# Patient Record
Sex: Female | Born: 1995 | Race: White | Hispanic: No | Marital: Single | Smoking: Never smoker
Health system: Southern US, Community
[De-identification: ages and names within clinical notes are randomized; demographics above are authoritative.]

---
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
## 2015-10-26 ENCOUNTER — Emergency Department (HOSPITAL_COMMUNITY): Payer: Managed Care, Other (non HMO)

## 2015-10-26 ENCOUNTER — Encounter (HOSPITAL_COMMUNITY): Payer: Self-pay | Admitting: *Deleted

## 2015-10-26 DIAGNOSIS — R002 Palpitations: Secondary | ICD-10-CM | POA: Diagnosis present

## 2015-10-26 LAB — BASIC METABOLIC PANEL
ANION GAP: 9 (ref 5–15)
BUN: 16 mg/dL (ref 6–20)
CALCIUM: 9.5 mg/dL (ref 8.9–10.3)
CO2: 27 mmol/L (ref 22–32)
Chloride: 102 mmol/L (ref 101–111)
Creatinine, Ser: 0.57 mg/dL (ref 0.44–1.00)
Glucose, Bld: 117 mg/dL — ABNORMAL HIGH (ref 65–99)
POTASSIUM: 4.6 mmol/L (ref 3.5–5.1)
SODIUM: 138 mmol/L (ref 135–145)

## 2015-10-26 LAB — CBC
HEMATOCRIT: 35.7 % — AB (ref 36.0–46.0)
HEMOGLOBIN: 10.3 g/dL — AB (ref 12.0–15.0)
MCH: 22.2 pg — ABNORMAL LOW (ref 26.0–34.0)
MCHC: 28.9 g/dL — ABNORMAL LOW (ref 30.0–36.0)
MCV: 77.1 fL — ABNORMAL LOW (ref 78.0–100.0)
Platelets: 421 10*3/uL — ABNORMAL HIGH (ref 150–400)
RBC: 4.63 MIL/uL (ref 3.87–5.11)
RDW: 18.4 % — ABNORMAL HIGH (ref 11.5–15.5)
WBC: 5.6 10*3/uL (ref 4.0–10.5)

## 2015-10-26 LAB — I-STAT TROPONIN, ED: TROPONIN I, POC: 0 ng/mL (ref 0.00–0.08)

## 2015-10-26 NOTE — ED Triage Notes (Signed)
Pt states for about a week has pain in her rib cage and back with movements, also feels like with any exertion feels like her heart beats fast, lasting up to a minute or two.

## 2015-10-27 ENCOUNTER — Emergency Department (HOSPITAL_COMMUNITY)
Admission: EM | Admit: 2015-10-27 | Discharge: 2015-10-27 | Disposition: A | Discharge status: Home / Self Care | Payer: Managed Care, Other (non HMO) | Attending: Emergency Medicine | Admitting: Emergency Medicine

## 2015-10-27 DIAGNOSIS — R000 Tachycardia, unspecified: Secondary | ICD-10-CM

## 2015-10-27 DIAGNOSIS — R002 Palpitations: Secondary | ICD-10-CM

## 2015-10-27 LAB — D-DIMER, QUANTITATIVE: D-Dimer, Quant: 0.46 ug/mL-FEU (ref 0.00–0.50)

## 2015-10-27 LAB — POC URINE PREG, ED: PREG TEST UR: NEGATIVE

## 2015-10-27 NOTE — ED Provider Notes (Signed)
[facility]PT Provider Note   CSN: [id number] Arrival date & time: 10/26/15  2028     History   Chief Complaint Chief Complaint  Patient presents with  . Palpitations    HPI Zhania Shaheen is a 20 y.o. female.  Patient is complaining about pain around the right side of her chest and into her back that has been present for approximately 1 week. Patient reports that she has noticed that her pain worsens when she takes a deep breath and does feel short of breath. Over this period of time she has noticed an accelerated heart rate. She has been sensing fast heartbeats and palpitations. She reports that the symptoms worsen with minimal exertion as well. She is normally very active person, grossly gym nearly every day. She has not been able to go because of the symptoms for the last couple of days. She denies any injury to the area.      History reviewed. No pertinent past medical history.  There are no active problems to display for this patient.   History reviewed. No pertinent surgical history.  OB History    No data available       Home Medications    Prior to Admission medications   Medication Sig Start Date End Date Taking? Authorizing Provider  acetaminophen (TYLENOL) 500 MG tablet Take 1,000 mg by mouth every 6 (six) hours as needed for moderate pain.   Yes Historical Provider, MD  amphetamine-dextroamphetamine (ADDERALL XR) 25 MG 24 hr capsule Take 25 mg by mouth every morning.   Yes Historical Provider, MD  amphetamine-dextroamphetamine (ADDERALL) 10 MG tablet Take 10 mg by mouth daily as needed (ADHD).   Yes Historical Provider, MD  ibuprofen (ADVIL,MOTRIN) 200 MG tablet Take 400 mg by mouth every 6 (six) hours as needed for moderate pain.   Yes Historical Provider, MD  sertraline (ZOLOFT) 50 MG tablet Take 50 mg by mouth daily.   Yes Historical Provider, MD    Family History No family history on file.  Social History Social History  Substance Use Topics    . Smoking status: Never Smoker  . Smokeless tobacco: Never Used  . Alcohol use Yes     Allergies   Review of patient's allergies indicates no known allergies.   Review of Systems Review of Systems  Cardiovascular: Positive for chest pain and palpitations.  All other systems reviewed and are negative.    Physical Exam Updated Vital Signs BP 119/74   Pulse 77   Temp 98.5 F (36.9 C) (Oral)   Resp 19   Ht 5\' 7"  (1.702 m)   Wt 127 lb (57.6 kg)   LMP 10/10/2015 (Exact Date)   SpO2 99%   BMI 19.89 kg/m   Physical Exam  Constitutional: She is oriented to person, place, and time. She appears well-developed and well-nourished. No distress.  HENT:  Head: Normocephalic and atraumatic.  Right Ear: Hearing normal.  Left Ear: Hearing normal.  Nose: Nose normal.  Mouth/Throat: Oropharynx is clear and moist and mucous membranes are normal.  Eyes: Conjunctivae and EOM are normal. Pupils are equal, round, and reactive to light.  Neck: Normal range of motion. Neck supple.  Cardiovascular: Regular rhythm, S1 normal and S2 normal.  Exam reveals no gallop and no friction rub.   No murmur heard. Pulmonary/Chest: Effort normal and breath sounds normal. No respiratory distress. She exhibits tenderness.    Abdominal: Soft. Normal appearance and bowel sounds are normal. There is no hepatosplenomegaly. There is no  tenderness. There is no rebound, no guarding, no tenderness at McBurney's point and negative Murphy's sign. No hernia.  Musculoskeletal: Normal range of motion.  Neurological: She is alert and oriented to person, place, and time. She has normal strength. No cranial nerve deficit or sensory deficit. Coordination normal. GCS eye subscore is 4. GCS verbal subscore is 5. GCS motor subscore is 6.  Skin: Skin is warm, dry and intact. No rash noted. No cyanosis.  Psychiatric: Her speech is normal and behavior is normal. Thought content normal. Her mood appears anxious.  Nursing note and  vitals reviewed.    ED Treatments / Results  Labs (all labs ordered are listed, but only abnormal results are displayed) Labs Reviewed  BASIC METABOLIC PANEL - Abnormal; Notable for the following:       Result Value   Glucose, Bld 117 (*)    All other components within normal limits  CBC - Abnormal; Notable for the following:    Hemoglobin 10.3 (*)    HCT 35.7 (*)    MCV 77.1 (*)    MCH 22.2 (*)    MCHC 28.9 (*)    RDW 18.4 (*)    Platelets 421 (*)    All other components within normal limits  D-DIMER, QUANTITATIVE ([facility])  Rosezena SensorI-STAT TROPOININ, ED  POC URINE PREG, ED    EKG  EKG Interpretation  Date/Time:  Monday October 26 2015 20:34:05 EDT Ventricular Rate:  122 PR Interval:  132 QRS Duration: 84 QT Interval:  306 QTC Calculation: 436 R Axis:   96 Text Interpretation:  Sinus tachycardia Biatrial enlargement Rightward axis Pulmonary disease pattern Nonspecific ST abnormality Abnormal ECG No previous tracing Confirmed by Blinda LeatherwoodPOLLINA  MD, Eliora Nienhuis [phone]([id number]) on 10/27/2015 3:55:30 AM       Radiology Dg Chest 2 View  Result Date: 10/26/2015 CLINICAL DATA:  Initial evaluation for acute chest pain, palpitations. EXAM: CHEST  2 VIEW COMPARISON:  None. FINDINGS: The heart size and mediastinal contours are within normal limits. Both lungs are clear. The visualized skeletal structures are unremarkable. IMPRESSION: No active cardiopulmonary disease. Electronically Signed   By: Rise MuBenjamin  McClintock M.D.   On: 10/26/2015 21:43    Procedures Procedures (including critical care time)  Medications Ordered in ED Medications - No data to display   Initial Impression / Assessment and Plan / ED Course  I have reviewed the triage vital signs and the nursing notes.  Pertinent labs & imaging results that were available during my care of the patient were reviewed by me and considered in my medical decision making (see chart for details).  Clinical Course    Patient presents to  the emergency department for evaluation of chest pain and palpitations. Symptoms have been ongoing for more than a week. Patient experiencing pain across her lower rib field and there is some tenderness associated with the pain. She does report increased pain with taking deep breaths. She is a nonsmoker and is not on oral contraceptives. She does not have a history of hypertension. She has essentially no risk factors for PE. She is in the low risk group by Wells criteria. Her d-dimer is also negative.  Patient does have intermittent episodes of sinus tachycardia here in the ER. When resting by herself in the room her heart rate drops, but when she is interviewed her heart rate increases. She reports that she feels more shortness of breath and pressure in her chest when she talks as well as exerting herself. Etiology at this  time is unclear. It is not felt that this is acute coronary syndrome and it is felt that PE is adequately ruled out. She will be referred to cardiology for repeat evaluation and possible further investigation.  Final Clinical Impressions(s) / ED Diagnoses   Final diagnoses:  Palpitations  Sinus tachycardia [facility])    New Prescriptions New Prescriptions   No medications on file     Gilda Crease, MD 10/27/15 [phone]

## 2015-11-20 ENCOUNTER — Ambulatory Visit

## 2015-11-20 NOTE — Progress Notes (Signed)
* * *      **  Laura Pennington**    ------    67 Y old Female, DOB: January 30, 1996    [address] [place], [place], [place] [address]    Home: [phone]    Provider: Burgess Amor[facility]        * * *    Telephone Encounter    ---    Answered by  Aida Raider Date: 11/20/2015       Time: 10:22 AM    Message                     Pt called and she is visitng from [place] where she goes to school and she wants to have a cardiologist here. She has records from her Cardiologist in [place] she would like a 2nd opinion she stated she has a irrregular heart rate. She will be going back to [facility] on Tuesday night. Dr. Harold Hedge [facility]        ------            Action Taken  [facility] 11/20/2015 10:37:12 AM > Patient is going to  be calling her cardiologist to fax the records- Got pcp note scanned in  Terenzi,Lisa 11/20/2015 2:31:05 PM > Delcid,Maira 11/20/2015 3:19:26 PM > I  spoke with pt's mom-Susan and per their request pt is booked for next monday  11/23/15 In [place].                * * *                ---          * * *         Patient: Laura Pennington, Laura Pennington DOB: 08-Jun-1995 Provider: Georga Kaufmann  11/20/2015    ---    Note generated by eClinicalWorks EMR/PM Software ([email or web address])

## 2015-11-23 ENCOUNTER — Ambulatory Visit: Admitting: Cardiovascular Disease

## 2015-11-23 ENCOUNTER — Other Ambulatory Visit: Payer: Self-pay | Admitting: Cardiology

## 2015-11-23 DIAGNOSIS — R0789 Other chest pain: Secondary | ICD-10-CM

## 2015-11-23 NOTE — Progress Notes (Signed)
* * *      **[name]**    ------    20 Y old Female, DOB: [birth year]    Account Number: [id number]    [address] [place], [id number]    Home: [phone]    Guarantor: [name] Insurance: Monia Pouch Payer ID: [id number]    PCP: Silvestre Mesi, MD Referring: Silvestre Mesi, MD    [facility] [place]        * * *    11/23/2015 Progress Notes: Carlota Raspberry, MD    ------    ---       **Current Medications**    ---    Taking    * Adderall XR 20 MG Capsule Extended Release 24 Hour 1 capsule in the morning Orally Once a day    ---    * Adderall 10 MG Tablet 1 tablet in the morning Orally Once a day as needed    ---    * Zoloft 50 MG Tablet 1 tablet Orally Once a day    ---    * Medication List reviewed and reconciled with the patient    ---      **Active Problem List**    ---      R00.2     Palpitations        ------    R07.89     Other chest pain        ------      **Past Medical History**    ---      Cleidocranial dystosis    ---    Chart hx of ADD, anorexia    ---      **Cardiovascular Diagnostics**    ---      ECHO: [place]. LVEF 55%. Nml wall motion. Nml RV size/fn. Trivial  MR/TR    ---    Holter: 48 H. 10-30-15. Max HR 171 bpm at 9:52 pm. Sinus vs AT. Gradual onset  and termination.    ---    ETT: sept 2017. 13 min. Bruce. 115 to 196 bpm. 98%.    ---     **Family History**    ---      Father: alive    ---    Mother: alive    ---    Negative for premature CAD or SCD.    ---      **Social History**    ---     _Tobacco Use:_    Smoking : Patient is a Current Smoker.    Goes to [employer] in [place]. [place]. Outside of [place].      **Allergies**    ---      N.K.D.A.    ---      **Review of Systems**    ---     _General/Constitutional_ :    Patient complaining of lightheadedness.    _Respiratory_ :    Patient denies cough.    _Cardiovascular_ :    Patient denies difficulty laying flat, fluid accumulation in the legs. Patient  complaining of chest pain with  exertion, irregular heartbeat, palpitations.    _Gastrointestinal_ :    Patient denies nausea.    _Hematology_ :    Patient denies easy bruising.    _Psychiatric_ :    Patient denies anxiety.          **Reason for Appointment**    ---      1\. 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 Since early sept she has  palpitations, exertional palpitations/chest sensation/dyspnea and a decline in  her exercise capacity.    She feels more breatheless than usual when climbing stairs. She also feels her  heart beating fast with exercise and at times without exercise. She is not  certain she had a fulmonant episode when wearing the holter or during the ETT.    She was seen in ER with a negative D-Dimer, negative pregnancy test    To make a long story short she is s/p ETT, echo and 48 h Holter which were  interpreted as unremarkable.    However, on her holter she has a HR of 171 range when she thinks she may have  not been doing anything. This would suggest the possibility of an atrial  tachycardia.    There is no syncope.    This was an extensive visit discussing whether adderall and anxiety could be  contributing to her symptoms. I reviewed her echo, ETT and holter. I discussed  side effects of metoprolol. I discussed possible atrial tachycardia, EP study  and ablation.    She is very tearfull in the office and her QOL is greatly impacted by these  symptoms. She does not necessarilit think she is anxious but acknowledges her  above medical regimen,    No significant relationship to caffeine.      **Vital Signs**    ---    BP R 96/67, HR 91, Ht 67, Wt 132, BMI 20.67, Oxygen sat % 100    L 123/79.      **Physical Examination**    ---     _General Examination_ :    GENERAL APPEARANCE: alert, in no acute distress.    HEAD: normocephalic.    EYES: sclera non-icteric.    ORAL CAVITY: mucosa moist.     NECK/THYROID: carotid pulse normal, no carotid bruit, no jugular venous  distention.    SKIN: no rashes.    HEART: regular rate and rhythm, normal S1, normal S2.    LUNGS: clear to auscultation bilaterally.    CHEST: no costochondral tenderness.    ABDOMEN: soft, nontender, nondistended, bowel sounds present, no abdominal or  flank bruits.    MUSCULOSKELETAL: no focal muscle tenderness.    EXTREMITIES: no clubbing, cyanosis, or edema.    PERIPHERAL PULSES: 2+ posterior tibial, 2+ femoral, 2+ radial.    NEUROLOGIC: nonfocal, alert and oriented.    PSYCH: alert, oriented.         **EKG**    ---    NSR 91 bpm. Nml axis/intervals. No significant ST-T wave abnormalities.      **Assessment and Plan**    ---    1\. Palpitations - R00.2 (Primary)    ---    2\. Other chest pain - R07.89    ---     20 year old female with a palpitations and associated chest  discomfort/dyspnea. ALthough her 48 H holter was interpreted to be normal, she  is generated a HRof 171 bpm at 9:52 pm when she says she was not exercising.  This rhythm would other wise appear to be sinus tachycardia but if it were  truly rest in onset, atrial tachycardia is a consideration.    I discussed anxiety, catecholamine effects on the heart, possible SVT that has  not been captured, possible atrial tachycardia.  I doubt this is inappropriate  sinus tachycardia. There does not appear to be a relationship between  cleidocranial dystosis. I am going to review her Holter with EP and obtain a  30 day monitor to really trying to correlate her symptoms.    ---      **Treatment**    ---      **1\. Palpitations**    Start Metoprolol Tartrate Tablet, 25 MG, 1/2 tab twice daily, Orally, Twice a  day PRN, 30 day(s), 60, Refills 11    _LAB: TSH W/ REFLEX FREE T4_    _IMAGING: Event Monitor - Standard_    ---       **Diagnostic Imaging**    ------    _Imaging: Electrocardiogram (EKG)_    ---      **Procedure Codes**    ---      93000 -ELECTROCARDIOGRAM, COMPLETE     ---      **Follow Up**    ---    2 Months    Electronically signed by Carlota Raspberry , MD on 11/23/2015 at 04:12 PM EDT    Sign off status: Completed        * * [facility] [place]    [address]    [place], [place] [address]    Tel: [phone]    Fax: [phone]              * * *         Patient: Angelik, Walls DOB: Jul 14, 1995 Progress Note: Carlota Raspberry, MD  11/23/2015    ---    Note generated by eClinicalWorks EMR/PM Software ([email or web address])

## 2015-11-24 ENCOUNTER — Encounter: Payer: Self-pay | Admitting: Cardiovascular Disease

## 2015-11-26 ENCOUNTER — Ambulatory Visit

## 2015-12-01 ENCOUNTER — Ambulatory Visit: Admitting: Cardiovascular Disease

## 2015-12-01 NOTE — Progress Notes (Signed)
* * *      **  Laura Pennington**    ------    60 Y old Female, DOB: 10-24-1995    [address], [place], [place], [place] [address]    Home: [phone]    Provider: Juanita Craver        * * *    Telephone Encounter    ---    Answered by  Leda Quail Date: 12/01/2015       Time: 09:28 AM    Caller  Laura Pennington patients mom    ------            Reason  further testing            Message                     Laura Pennington patients mom called and would like to talk with you in regards to [facility] and whats going on and coordinate with all the drs and get everyone on the same page. Also pediatrician did some further testing that she will have them fax over for you to look at.       Laura Pennington (pts mom) [phone]                Action Taken  Nada Libman 12/01/2015 3:37:12 PM > Records from pediatric  came trought the fax, they are on your desk. Juanita Craver 12/03/2015  7:38:33 AM > I talked with mother                * * *                ---          * * *         Patient: Laura Pennington, Laura Pennington DOB: Oct 21, 1995 Provider: Juanita Craver 12/01/2015    ---    Note generated by eClinicalWorks EMR/PM Software ([email or web address])

## 2015-12-02 ENCOUNTER — Ambulatory Visit (HOSPITAL_COMMUNITY): Admission: RE | Admit: 2015-12-02 | Payer: Managed Care, Other (non HMO) | Source: Ambulatory Visit

## 2015-12-03 ENCOUNTER — Ambulatory Visit: Admitting: Cardiovascular Disease

## 2015-12-03 NOTE — Progress Notes (Signed)
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  She has an atrial tachycardia vs sinus tachycardia.      She needs to see PJB.  Can you schedule her on the day that she scheduled to see me or around that day with PJB. (cancel me ) She will be home then for Thanksgiving.  Please call her mother to to schedule Darl Pikes (pts mom) [phone]                 Action Taken  Nada Libman 12/03/2015 3:54:14 PM > Ok forwarding this bean to  [place] to book w/PJB on 11/20 or around this date and cancelled appt w/ESD  which is also booked same day Leach,Kaitlyn 12/04/2015 9:30:41 AM > Spoke to  patients mom booked with PJB for 11/22. ESD appt. cancelled.                * * *                ---          * * *         Patient: Laura, Pennington DOB: Jun 17, 1995 Provider: Juanita Craver 12/03/2015    ---    Note generated by eClinicalWorks EMR/PM Software ([email or web address])

## 2015-12-07 ENCOUNTER — Ambulatory Visit

## 2015-12-25 ENCOUNTER — Ambulatory Visit: Admitting: Cardiovascular Disease

## 2015-12-25 NOTE — Progress Notes (Signed)
* * *      **  Laura Pennington**    ------    61 Y old Female, DOB: 1995/09/11    [address] [place], [place], [place] [address]    Home: [phone]    Provider: Juanita Craver        * * *    Telephone Encounter    ---    Answered by  Albesa Seen Date: 12/25/2015       Time: 05:09 PM    Reason  monitor return    ------            Message                     LMOM to return monitor                Action Taken  Bunting,Melissa 12/25/2015 5:11:11 PM >                * * *                ---          * * *         Patient: Laura Pennington DOB: 1995/10/20 Provider: Juanita Craver 12/25/2015    ---    Note generated by eClinicalWorks EMR/PM Software ([email or web address])

## 2015-12-29 ENCOUNTER — Ambulatory Visit: Admitting: Cardiovascular Disease

## 2015-12-30 ENCOUNTER — Ambulatory Visit: Admitting: Cardiovascular Disease

## 2015-12-30 NOTE — Progress Notes (Signed)
* * *      **Laura Pennington**    ------    2 Y old Female, DOB: [birth year]    Account Number: [id number]    [address], [place], [place]    Home: [phone]    Guarantor: Laura Pennington Insurance: Monia Pouch Payer ID: [id number]    PCP: Silvestre Mesi, MD Referring: Silvestre Mesi, MD    Appointment Facility: [facility]        * * *    12/30/2015 Progress Notes: Sherre Lain, MD    ------    ---       **Current Medications**    ---    Taking    * Metoprolol Tartrate 25 MG Tablet 1/2 tab twice daily Orally Twice a day    ---    * Adderall XR 20 MG Capsule Extended Release 24 Hour 1 capsule in the morning Orally Once a day    ---    * Adderall 10 MG Tablet 1 tablet in the morning Orally Once a day as needed    ---    * Zoloft 50 MG Tablet 1 tablet Orally Once a day    ---    * Medication List reviewed and reconciled with the patient    ---      **Active Problem List**    ---      R00.2     Palpitations        ------    R07.89     Other chest pain        ------      **Past Medical History**    ---      Cleidocranial dystosis    ---    ADD    ---    Anorexia    ---      **Cardiovascular Diagnostics**    ---      ECHO: [place]. LVEF 55%. Nml wall motion. Nml RV size/fn. Trivial  MR/TR    ---    Holter: 48 H. 10-30-15. Max HR 171 bpm at 9:52 pm. Sinus vs AT. Gradual onset  and termination.    ---    ETT: sept 2017. 13 min. Bruce. 115 to 196 bpm. 98%.    ---    TTM: 12/25/15: Sx ST. Pt reports HR as high as 173 bpm w/min exertion/rest.  TTM suggests inappropriate ST.    ---     **Family History**    ---      Father: alive    ---    Mother: alive    ---    Negative for premature CAD or SCD.    ---      **Social History**    ---     _Tobacco Use:_    Smoking : Patient is a Non smoker.    Goes to [employer] in [place] which is outside of [place]. She drinks one  cup of coffee each day. She does not drink energy drinks.      **Allergies**    ---      N.K.D.A.    ---       **Review of Systems**    ---     _General/Constitutional_ :    Patient denies fever or chills.    _ENT_ :    Patient denies nosebleeds.    _Respiratory_ :    Patient denies cough, wheezing,or hemoptysis.    _Gastrointestinal_ :    Patient denies blood in stool.    _Hematology_ :  Patient denies prolonged bleeding.    _Genitourinary_ :    Patient denies blood in the urine.    _Musculoskeletal_ :    Patient denies muscle aches.    _Peripheral Vascular_ :    Patient denies claudication.    _Skin_ :    Patient denies hives.    _Neurologic_ :    Patient denies focal weakness.          **Reason for Appointment**    ---      1\. Palpitations    ---    2\. Tachycardia    ---      **History of Present Illness**    ---     __ :    Ms. Laura Pennington has been referred to me by Dr. Ignacia Palma for further  evaluation of palpitations and tachycardia. As you recall, Ms. 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 She reports that she was doing well until this past September when  she started to experience chest pain when taking a deep breath. She also  noticed the onset of a racing heart beat with minimal exertion. She denies any  viral symptoms at the time. She was evaluated by a cardiologist in [place]. Her  cardiac evaluation included a stress test, echocardiogram, and a 48 hour  Holter monitor. All of the studies were unremarkable. She was followed  expectantly and her chest pain resolved about a week later. However, she  continued to have a sensation of an inappropriately elevated heart rate at  rest and with minimal exertion. She was evaluated by Dr. Ignacia Palma in October  and an event monitor was placed. The monitor revealed heart rates up to 171  beats per minute while at rest and with minimal exertion. Suspecting that she  had either inappropriate sinus tachycardia or an atrial tachycardia, Dr.  Ignacia Palma prescribed metoprolol tartrate 12.5 mg twice daily. Her  palpitations  have improved, but they have not resolved. Otherwise, Ms. Laura Pennington has been  doing well. She denies any episodes of presyncope, syncope, orthopnea, or  lower extremity edema. She had an autonomic test done yesterday at [facility]. She  is awaiting these results. She denies any heat or cold intolerance. She also  denies any nausea, vomiting, diarrhea, or constipation. She stopped taking  Adderall for two weeks as a trial and her symptoms did not improve. A Lyme  screen was negative. She is being tested for [employer] virus. She reports  having normal thyroid function tests.      **Vital Signs**    ---    BP 103/62, HR 82, Ht 67, Wt 133, BMI 20.83, Oxygen sat % 100.      **Physical Examination**    ---     _General Examination_ :    GENERAL APPEARANCE: well developed, well nourished.    HEAD: normocephalic, atraumatic.    ORAL CAVITY: moist mucous membranes.    NECK/THYROID: carotid pulse normal, no carotid bruit.    HEART: regular rate and rhythm, normal S1, normal S2, no murmur, rub, or  gallop.    LUNGS: clear to auscultation bilaterally.    CHEST: normal shape and expansion.    ABDOMEN: soft, nontender, nondistended, bowel sounds present.    EXTREMITIES: no edema.    PERIPHERAL PULSES: 2+ throughout.    NEUROLOGIC: alert and oriented.    PSYCH: cooperative with exam.         **EKG**    ---  Normal sinus rhythm at a rate of 81 bpm. Normal axis and intervals. No  significant changes compared to her previous electrocardiogram.      **Assessment and Plan**    ---    1\. Palpitations - R00.2 (Primary)    ---    2\. Other chest pain - R07.89    ---     IMPRESSION: Ms. Laura Pennington is a healthy 20 year old female with recent onset of  palpitations. Cardiac testing has revealed a structurally normal heart. An  event monitor has revealed evidence of either inappropriate sinus tachycardia  or atrial tachycardia. She has no other symptoms to suggest autonomic  dysfunction. I think a diagnosis of POTS is unlikely as  many of her episodes  occur while she is sitting. We had a long discussion regarding her treatment  options. These options would include continuing the current dose of  metoprolol, switching to diltiazem, initiating an antiarrhythmic agent, or  proceeding to an electrophysiology study. Her preference is to continue the  current dose of metoprolol which I think is reasonable. She is scheduled to  return to my office in May after she returns from college. Thank you for  allowing me the opportunity to participate in the care of Laura Pennington. As  always, please feel free to call with any questions.    ---      **Treatment**    ---      **1\. Palpitations**    Continue Metoprolol Tartrate Tablet, 25 MG, 1/2 tab twice daily, Orally, Twice  a day    ---       **Diagnostic Imaging**    ------    _Imaging: Electrocardiogram (EKG)_    ---      **Procedure Codes**    ---      93000 -ELECTROCARDIOGRAM, COMPLETE    ---      **Follow Up**    ---    6 Months    Electronically signed by Sherre Lain , MD on 12/30/2015 at 06:00 PM EST    Sign off status: Completed        * * [facility]    [address]    [place], [place] [id number]    Tel: [phone]    Fax: [phone]              * * *         Patient: Laura Pennington, Laura Pennington DOB: [birth year] Progress Note: Sherre Lain, MD  12/30/2015    ---    Note generated by eClinicalWorks EMR/PM Software ([email or web address])

## 2015-12-30 NOTE — Progress Notes (Signed)
* * *      **  Laura Pennington**    ------    22 Y old Female, DOB: 04-07-1995    [address], [place], [place], [place] [address]    Home: [phone]    Provider: Juanita Craver        * * *    Telephone Encounter    ---    Answered by  Albesa Seen Date: 12/30/2015       Time: 04:21 PM    Reason  monitor return    ------            Message                     monitor has been returned                Action Taken  Bunting,Melissa 12/30/2015 4:21:29 PM >                * * *                ---          * * *         Patient: Laura Pennington DOB: 08/10/1995 Provider: Juanita Craver 12/30/2015    ---    Note generated by eClinicalWorks EMR/PM Software ([email or web address])

## 2015-12-30 NOTE — Progress Notes (Signed)
* * *      **  [name]**    ------    35 Y old Female, DOB: Nov 16, 1995    [address] [place], [place], [place] [address]    Home: [phone]    Provider: Juanita Craver        * * *    Telephone Encounter    ---    Answered by  Deanne Coffer Date: 12/30/2015       Time: 05:51 PM    Reason  consult    ------            Message                     Luan Pulling,       I met with Tresa Endo and her mother today.  It is still not clear to me if she has inappropriate sinus tachycardia or atrial tachycardia.  We had a long discussion regarding her treatment options.  We agreed to continue the current dose of metoprolol.  If this fails, then we will consider an ablation therapy with the hope the weekend eliminating atrial tachycardia.  I tentatively scheduled a follow-up appointment for her with me in May.  It is okay with you if I follow her?      PB                Action Taken  No problem. Thanks for seeing her. Juanita Craver 01/03/2016  4:52:26 PM >                * * *                ---          * * *         Patient: Brianni, Manthe DOB: [birth year] Provider: Juanita Craver 12/30/2015    ---    Note generated by eClinicalWorks EMR/PM Software ([email or web address])

## 2016-01-06 ENCOUNTER — Ambulatory Visit

## 2016-07-12 LAB — HX CBC W/ DIFF
HX BASO#: 0.1 10*3/uL (ref 0.0–0.1)
HX BASO%: 0.9 %
HX EOS#: 0.1 10*3/uL (ref 0.0–0.5)
HX EOS%: 1.3 %
HX HCT: 37.4 % (ref 36.0–48.0)
HX HGB: 11.3 g/dL — ABNORMAL LOW (ref 12.0–16.0)
HX LYMPH#: 1.8 10*3/uL (ref 1.0–4.0)
HX LYMPH%: 33.8 %
HX MCH: 25.2 pg — ABNORMAL LOW (ref 27.0–32.0)
HX MCHC: 30.2 g/dL — ABNORMAL LOW (ref 32.0–36.0)
HX MCV: 83.5 FL (ref 79.0–97.0)
HX MONO#: 0.4 10*3/uL (ref 0.0–1.5)
HX MONO%: 8.3 %
HX NUCLEATED RBC %: 0 /100{WBCs}
HX PLT: 379 10*3/uL (ref 150–400)
HX RBC: 4.48 10*6/uL (ref 4.00–5.20)
HX RDW: 20.6 % — ABNORMAL HIGH (ref 11.5–14.5)
HX SEG%: 55.5 %
HX SEGS#: 3 10*3/uL (ref 2.4–8.1)
HX WBC: 5.3 10*3/uL (ref 4.0–11.0)

## 2016-07-13 ENCOUNTER — Ambulatory Visit: Admitting: Cardiovascular Disease

## 2016-07-13 NOTE — Progress Notes (Signed)
* * *      [name]**    ------    89 Y old Female, DOB: Feb 20, 1995    Account Number: [id number]    [address] [place], [place]    Home: [phone]    Guarantor: [name] Insurance: Monia Pouch Payer ID: [id number]    PCP: Silvestre Mesi, MD Referring: Silvestre Mesi, MD    Appointment Facility: [facility]        * * *    07/13/2016 Follow Up: Sherre Lain, MD    ------    ---       **Current Medications**    ---    Taking    * Metoprolol Tartrate 25 MG Tablet 0.5 tablet Orally Twice a day as needed    ---    * Fish Oil 1000 MG Capsule 1 capsule Orally Once a day    ---    * Vitamin B12 100 MCG Tablet 1 tablet Orally Once a day    ---    * Iron 325 (65 Fe) MG Tablet 1 tablet Orally Once a day    ---    * Adderall XR 25 MG Capsule Extended Release 24 Hour 1 capsule in the morning Orally Once a day    ---    * Adderall 10 MG Tablet 1 tablet in the morning Orally Once a day as needed    ---    * Zoloft 50 MG Tablet 1 tablet Orally Once a day    ---    * Medication List reviewed and reconciled with the patient    ---      **Active Problem List**    ---      R00.2     Palpitations        ------      **Past Medical History**    ---      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 LVEF 55%. Nml wall motion. Nml RV size/fn. Trivial  MR/TR    ---    Holter: 48 H. 10-30-15. Max HR 171 bpm at 9:52 pm. Sinus vs AT. Gradual onset  and termination.    ---    ETT: sept 2017. 13 min. Bruce. 115 to 196 bpm. 98%.    ---    TTM: 12/25/15: Sx ST. Pt reports HR as high as 173 bpm w/min exertion/rest.  TTM suggests inappropriate ST.    ---     **Family History**    ---      Father: alive    ---    Mother: alive    ---    Negative for premature coronary artery disease or sudden cardiac  death.    ---      **Social History**    ---     _Tobacco Use:_    Smoking : Patient is a Non smoker.    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 She drinks one cup of  coffee each day. She does not drink energy drinks.      **Allergies**    ---      N.K.D.A.    ---      **  Review of Systems**    ---     _General/Constitutional_ :    Patient denies fevers or chills.    _Respiratory_ :    Patient denies hemoptysis.    _Gastrointestinal_ :    Patient denies blood in the stool.    _Peripheral Vascular_ :    Patient denies claudication.          **Reason for Appointment**    ---      1\. Palpitations    ---    2\. Tachycardia    ---      **History of Present Illness**    ---     __ :    Ms. Ercelle Winkles returns to the office for further evaluation of palpitations  and tachycardia. As you recall, Ms. Sachs is a 21 year old female with a  history of ADD, anorexia, and cleidocranial dystosis. She was doing well until  September 2017 when she started to experience chest pain when taking a deep  breath. She also noticed the onset of a racing heart beat with minimal  exertion. 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 She  was evaluated by a local cardiologist. Her cardiac evaluation included a  stress test, echocardiogram, and a 48 hour Holter monitor. All of the studies  were unremarkable. She was followed expectantly and her chest pain resolved  about a week later. However, she continued to have a sensation of an  inappropriately elevated heart rate at rest and with minimal exertion. She was  evaluated by Dr. Ignacia Palma in October 2017 and an event monitor was placed. The  monitor revealed heart rates up to 171 beats per minute while at rest and with  minimal exertion. Suspecting that she had either inappropriate sinus  tachycardia or an atrial tachycardia, Dr. Ignacia Palma prescribed metoprolol  tartrate 12.5 mg twice daily. Her palpitations have improved, but they have  not resolved.  I met with her in November 2017 and I suspected that her  episodes were secondary to either inappropriate sinus tachycardia or atrial  tachycardia. We reviewed her treatment options and her preference was to use  metoprolol on a prn basis. She has since found that the episodes are triggered  by anxiety. The episodes are less frequent and seem to respond to the  metoprolol. She denies any episodes of chest pain, shortness of breath,  presyncope, syncope, orthopnea, or lower extremity edema.      **Vital Signs**    ---    BP 94/62, HR 79, Ht 67, Wt 137, BMI 21.45, Oxygen sat % 98.      **Physical Examination**    ---     _General Examination_ :    GENERAL APPEARANCE: well developed, well nourished.    HEAD: normocephalic, atraumatic.    ORAL CAVITY: moist mucous membranes.    NECK/THYROID: carotid pulse normal, no carotid bruit.    HEART: regular rate and rhythm, normal S1, normal S2, no murmur, rub, or  gallop.    LUNGS: clear to auscultation bilaterally.    CHEST: normal shape and expansion.    ABDOMEN: soft, nontender, nondistended, bowel sounds present.    EXTREMITIES: no edema.    PERIPHERAL PULSES: 2+ throughout.    NEUROLOGIC: alert and oriented.    PSYCH: cooperative with exam.         **EKG**    ---    Normal sinus rhythm at a rate of 79 bpm. Normal axis and intervals. No  significant changes  compared to her previous electrocardiogram.      **Assessment and Plan**    ---    1\. Palpitations - R00.2 (Primary)    ---     IMPRESSION: Ms. Obeirne is doing well. I continue to suspect that her  palpitations are secondary to either inappropriate sinus tachycardia or atrial  tachycardia. At this point, her episodes are infrequent, brief, and mild in  severity. Therefore, I would recommend we continue to manage her palpitations  with metoprolol tartrate on a prn basis.    Thank you for allowing me the opportunity to continue to participate in the  care of Ms. Beadles. She is scheduled to return to my office in 1  year for  follow-up evaluation. I will certainly see her earlier if any new issues  arise. As always, please feel free to call with any questions.    ---      **Treatment**    ---      **1\. Palpitations**    Continue Metoprolol Tartrate Tablet, 25 MG, 0.5 tablet, Orally, Twice a day as  needed    ---       **Diagnostic Imaging**    ------    _Imaging: Electrocardiogram (EKG)_    ---      **Procedure Codes**    ---      93000 -ELECTROCARDIOGRAM, COMPLETE    ---      **Follow Up**    ---    1 Year    Electronically signed by Sherre Lain , MD on 07/13/2016 at 09:21 AM EDT    Sign off status: Completed        * * [facility]    [address]    [place], [place] [id number]    Tel: [phone]    Fax: [phone]              * * *         Patient: Logyn, Dedominicis DOB: 02-26-1995 Progress Note: Sherre Lain, MD  07/13/2016    ---    Note generated by eClinicalWorks EMR/PM Software ([email or web address])

## 2016-07-25 ENCOUNTER — Ambulatory Visit

## 2016-12-01 ENCOUNTER — Ambulatory Visit: Admitting: Cardiovascular Disease

## 2016-12-01 NOTE — Progress Notes (Signed)
* * *      **  Laura Pennington**    ------    64 Y old Female, DOB: 1995-08-30    [address], [place], [place], [place] [address]    Home: [phone]    Provider: Deanne Coffer        * * *    Telephone Encounter    ---    Answered by  Leda Quail Date: 12/01/2016       Time: 03:48 PM    Caller  patient    ------            Reason  cardiac clearance for medication            Message                     Patient needs a letter sent over for cardiac clearance letter faxed over to Dr Burnadette Pop, stating that its ok to continue taking the stimulant Aderol      fax: [phone]      phone: [phone]                Action Taken  Last office note faxed to number above stating PJB is aware  patient is taking Adderall. [facility] 12/01/2016 4:01:33 PM >                * * *                ---          * * *         Patient: Laura Pennington DOB: [birth year] Provider: Deanne Coffer  12/01/2016    ---    Note generated by eClinicalWorks EMR/PM Software ([email or web address])

## 2017-03-01 LAB — LIPID PROFILE (EXT)
Cholesterol (EXT): 162 mg/dL (ref 140–200)
HDL Cholesterol (EXT): 62 mg/dL (ref >40)
LDL Cholesterol, CALC (EXT): 88 mg/dL (ref 0–129)
Risk Factor (EXT): 2.6 (ref 0–4.4)
Triglycerides (EXT): 58 mg/dL (ref 0–149)

## 2017-03-01 LAB — CMP (EXT)
ALT/SGPT (EXT): 7 U/L — ABNORMAL LOW (ref 10–49)
AST/SGOT (EXT): 15 U/L (ref 6–40)
Albumin (EXT): 4.8 g/dL (ref 3.5–4.8)
Alkaline Phosphatase (EXT): 61 U/L (ref 27–129)
Anion Gap (EXT): 9 mmol/L (ref 3–17)
BUN (EXT): 14 mg/dL (ref 9–23)
Bilirubin, Total (EXT): 0.9 mg/dL (ref 0.0–1.0)
CO2 (EXT): 27 mmol/L (ref 20–31)
CalciumCalcium (EXT): 9.7 mg/dL (ref 8.7–10.4)
Chloride (EXT): 106 mmol/L (ref 95–106)
Creatinine (EXT): 0.73 mg/dL (ref 0.50–1.30)
GFR Estimated (Calc) (EXT): 116 mL/min/{1.73_m2} (ref >60)
Globulin (EXT): 2.6 g/dL (ref 1.9–4.1)
Glucose (EXT): 86 mg/dL (ref 74–106)
Potassium (EXT): 5.1 mmol/L (ref 3.5–5.2)
Protein (EXT): 7.4 g/dL (ref 5.7–8.2)
Sodium (EXT): 142 mmol/L (ref 136–145)

## 2017-04-10 IMAGING — CR DG CHEST 2V
2 series · 2 of 2 positions shown · non-contrast
Comparison: None.

CLINICAL DATA: Initial evaluation for acute chest pain,
palpitations.

EXAM:
CHEST  2 VIEW

[chest pa]
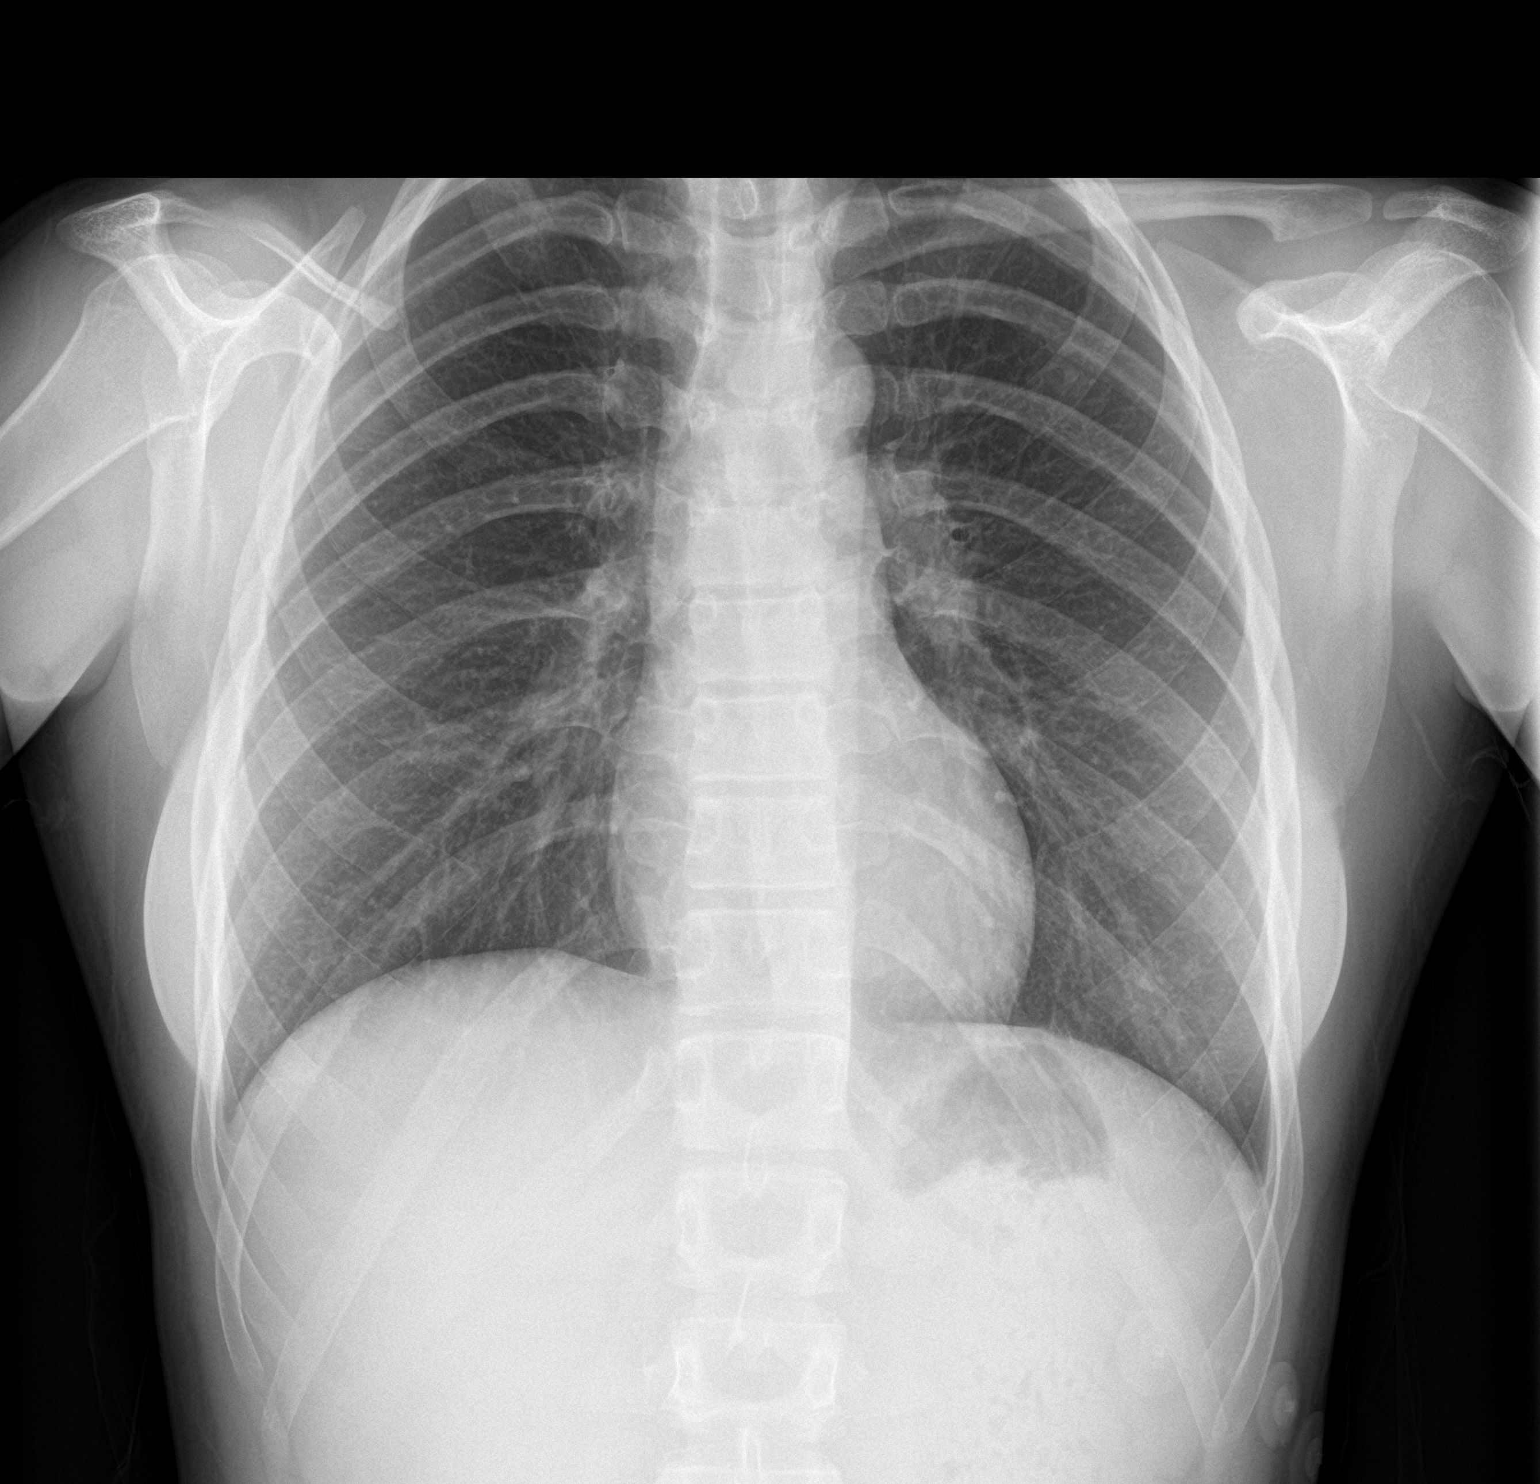

[chest lat]
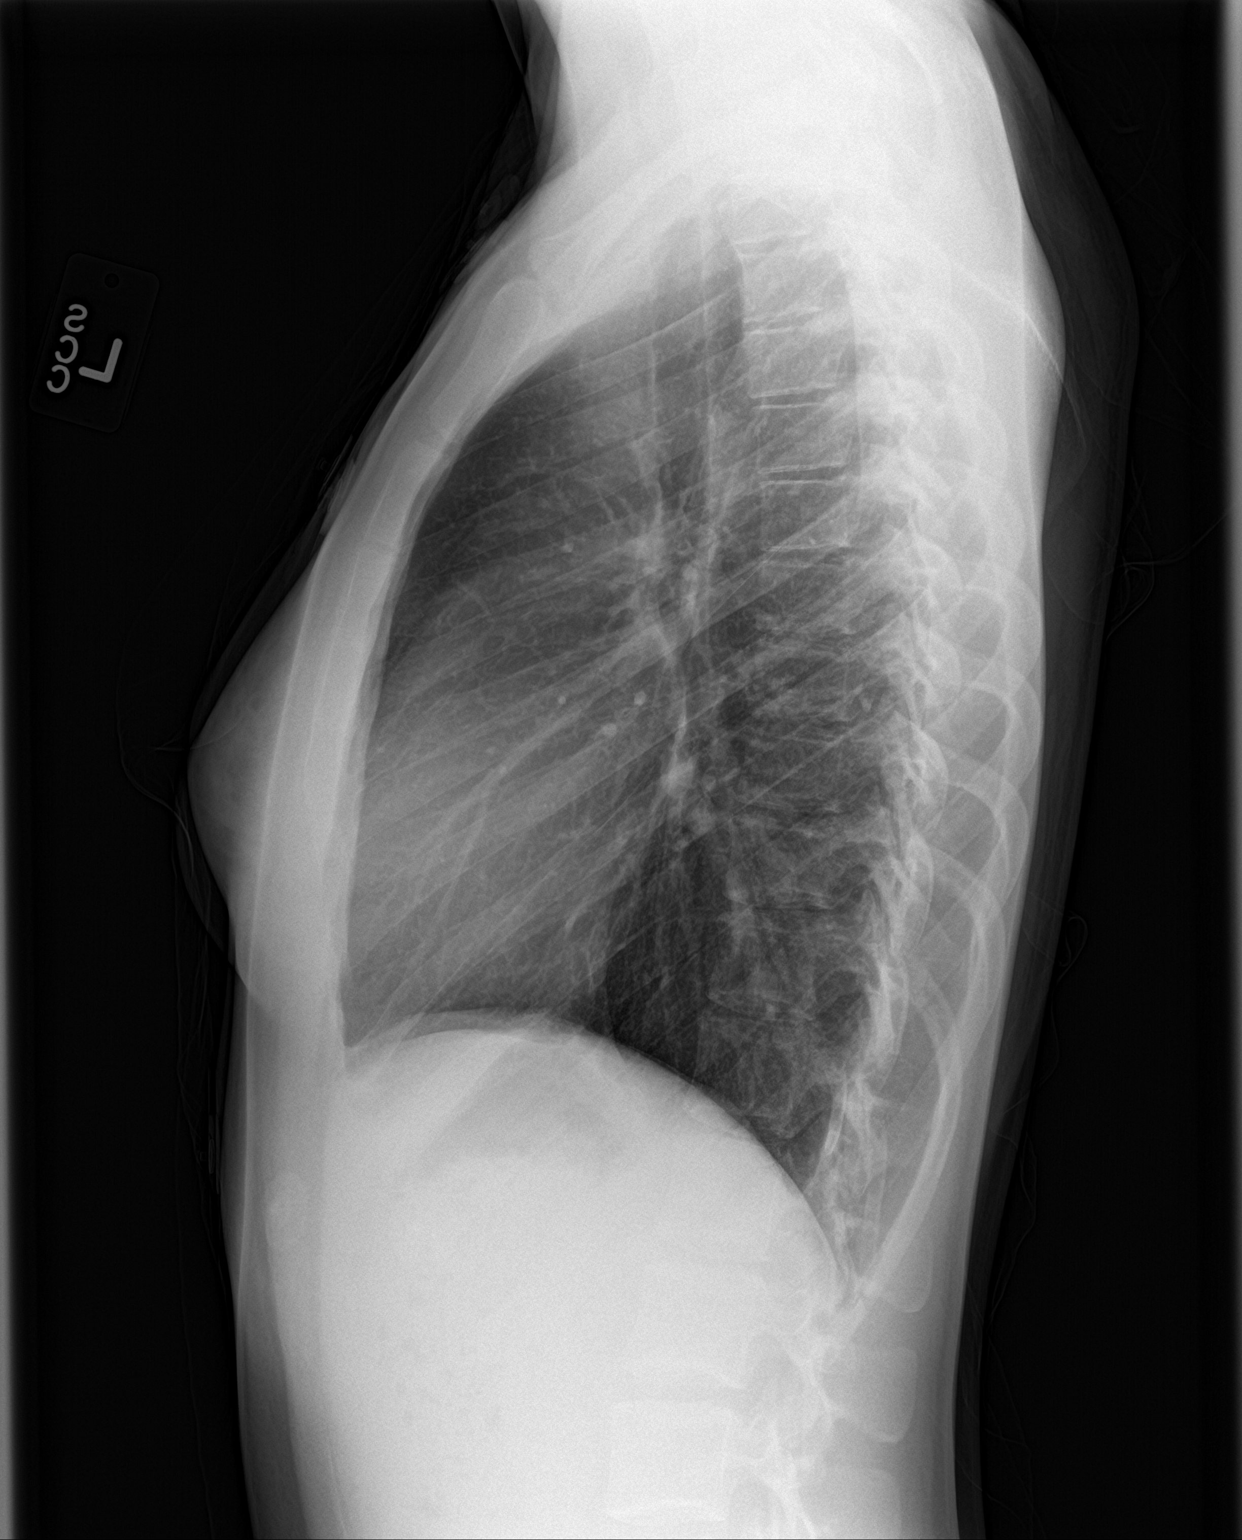

[2 of 2 positions shown; findings below may reference images not displayed]

FINDINGS: The heart size and mediastinal contours are within normal limits.
Both lungs are clear. The visualized skeletal structures are
unremarkable.
IMPRESSION: No active cardiopulmonary disease.

## 2017-07-27 ENCOUNTER — Ambulatory Visit: Admitting: Cardiovascular Disease

## 2017-07-27 NOTE — Progress Notes (Signed)
* * *      **  Laura Pennington**    ------    76 Y old Female, DOB: May 17, 1995    [address] [place], [place], [place] [address]    Home: [phone]    Provider: Deanne Coffer        * * *    Telephone Encounter    ---    Answered by  Leda Quail Date: 07/27/2017       Time: 09:20 AM    Reason  no show    ------            Action Taken  Leach,Kaitlyn 07/27/2017 10:28:20 AM > lm for cb Leach,Kaitlyn  07/28/2017 9:20:56 AM > letter sent                * * *                ---          * * *         Patient: Laura Pennington DOB: [birth year] Provider: Deanne Coffer  07/27/2017    ---    Note generated by eClinicalWorks EMR/PM Software ([email or web address])

## 2018-03-02 ENCOUNTER — Ambulatory Visit: Admitting: Cardiovascular Disease

## 2018-03-02 LAB — CMP (EXT)
ALT/SGPT (EXT): 14 U/L (ref 10–49)
AST/SGOT (EXT): 21 U/L (ref 6–40)
Albumin (EXT): 4.1 g/dL (ref 3.5–4.8)
Alkaline Phosphatase (EXT): 39 U/L (ref 27–129)
Anion Gap (EXT): 11 mmol/L (ref 3–17)
BUN (EXT): 10 mg/dL (ref 9–23)
Bilirubin, Total (EXT): 0.5 mg/dL (ref 0.0–1.0)
CO2 (EXT): 25 mmol/L (ref 20–31)
CalciumCalcium (EXT): 8.8 mg/dL (ref 8.7–10.4)
Chloride (EXT): 107 mmol/L — ABNORMAL HIGH (ref 95–106)
Creatinine (EXT): 0.73 mg/dL (ref 0.50–1.30)
GFR Estimated (Calc) (EXT): 115 mL/min/{1.73_m2} (ref >60)
Globulin (EXT): 2.7 g/dL (ref 1.9–4.1)
Glucose (EXT): 96 mg/dL (ref 74–106)
Potassium (EXT): 4.5 mmol/L (ref 3.5–5.2)
Protein (EXT): 6.8 g/dL (ref 5.7–8.2)
Sodium (EXT): 143 mmol/L (ref 136–145)

## 2018-03-02 LAB — LIPID PROFILE (EXT)
Cholesterol (EXT): 137 mg/dL — ABNORMAL LOW (ref 140–200)
HDL Cholesterol (EXT): 58 mg/dL (ref >40)
LDL Cholesterol, CALC (EXT): 70 mg/dL (ref 0–129)
NON HDL Cholesterol (EXT): 79 mg/dL
Risk Factor (EXT): 2.4 (ref 0–4.4)
Triglycerides (EXT): 47 mg/dL (ref 0–149)

## 2019-01-25 ENCOUNTER — Ambulatory Visit: Admitting: Cardiovascular Disease

## 2019-01-25 NOTE — Progress Notes (Signed)
 * * *      **[name]**    ------    77 Y old Female, DOB: June 04, 1995    [address] [place], [place], [place] [address]    Home: [phone]    Provider: Deanne Coffer        * * *    Telephone Encounter    ---    Answered by  Morrie Sheldon Date: 01/25/2019       Time: 11:39 AM    Caller  Patient [phone]    ------            Reason  Palps P[facility] Appt needed.            Message                     I spoke to patient today. She has been checking her BP and she has noticed it has been a little higher and there is an irregular Heartbeat noted as well. Patient has been noticing palps more often. Please advise.                 Action Taken                     Gavric,Lilly  01/25/2019 11:47:27 AM > Spoke with pt. has not been seen since 2018. She wants to sched. an appt. Her BPs have been high and monitor says irregular heart beat. She does not have Numbers to provide :) Palps on and off, couple times a week, sometimes every day. last for about 10 min. SOb when it happens. vyvansse 60 QD.lexapro 20 mg. Please advise. Thanks      Coode,Bobbi K 01/25/2019 12:01:38 PM > She did not show for her follow up. Can reschedule a non urgent consult with Dr. Cora Daniels when available, thanks      Leach,Kaitlyn  01/25/2019 3:20:08 PM > lm for cb to schedule, I can speak with her.      Thomas,Starlene  01/29/2019 3:43:27 PM > left message for patient      Delcid,Maira  02/06/2019 1:20:23 PM >       Delcid,Maira  02/06/2019 1:21:04 PM > called pt's cell and unable to leave msg as VM box is full but I left a msg at her mom's cellphone. Kait, pt is not returning any of the calls since Dec 18, maybe send her a letter?      Leach,Kaitlyn  02/06/2019 2:41:57 PM > Letter sent to pt asking her to call the office and schedule an appt with PJB.                     * * *              * * *        ---      Reason for Appointment    ---     1\. Palps PJB Appt needed.    ---     Current Medications    ---     Taking    * Vyvanse 60 MG Capsule 1 capsule in the morning Orally Once a day    ---    * Lexapro 20 MG Tablet 1 tablet Orally Once a day    ---    Not-Taking    * Metoprolol Tartrate 25 MG Tablet 0.5 tablet Orally Twice a day as needed    ---    * Medication  List reviewed and reconciled with the patient    ---          * * *         Patient: Laura Pennington, Laura Pennington DOB: 25-Jan-1996 Provider: Deanne Coffer  01/25/2019    ---    Note generated by eClinicalWorks EMR/PM Software ([email or web address])

## 2019-02-14 ENCOUNTER — Ambulatory Visit

## 2019-02-27 ENCOUNTER — Ambulatory Visit: Admitting: Cardiovascular Disease

## 2019-02-27 NOTE — Progress Notes (Signed)
 * * *      Laura Pennington**    ------    40 Y old Female, DOB: 13-May-1995    Account Number: [id number]    [address], [place], [id number]    Home: [phone]    Guarantor: Laura Pennington Insurance: [employer]ia P[employer] Payer ID: [id number]    Appointment Facility: [facility]        * * *    02/27/2019 Follow Up: Sherre Lain, MD    ------    ---       **Current Medications**    ---    Taking    * Vyvanse 60 MG Capsule 1 capsule in the morning Orally Once a day    ---    * Lexapro 20 MG Tablet 1 tablet Orally Once a day    ---    * Nikki 3-0.02 MG Tablet 1 tablet Orally Once a day    ---    * Adderall 5 MG Tablet 1 tablet in the morning Orally Once a day as needed    ---    Not-Taking    * Metoprolol Tartrate 25 MG Tablet 0.5 tablet Orally Twice a day as needed    ---    * Fish Oil 1000 MG Capsule 1 capsule Orally Once a day    ---    * Vitamin B12 100 MCG Tablet 1 tablet Orally Once a day    ---    * Iron 325 (65 Fe) MG Tablet 1 tablet Orally Once a day    ---    * Adderall XR 25 MG Capsule Extended Release 24 Hour 1 capsule in the morning Orally Once a day    ---    * Zoloft 50 MG Tablet 1 tablet Orally Once a day    ---      **Active Problem List**    ---      R00.2 Palpitations    ------         Past Medical History    ---      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        ---    Cleidocranial dystosis.        ---    ADD .        ---    Anorexia.        ---    Anxiety.        ---      **Cardiovascular Diagnostics**    ---      ECHO: [place]. LVEF 55%. Nml wall motion. Nml RV size/fn. Trivial  MR/TR    ---    Holter: 48 H. 10-30-15. Max HR 171 bpm at 9:52 pm. Sinus vs AT. Gradual onset  and termination.    ---    ETT: sept 2017. 13 min. Bruce. 115 to 196 bpm. 98%.    ---    TTM: 12/25/15: Sx ST. Pt reports HR as high as 173 bpm w/min exertion/rest.  TTM suggests inappropriate ST.    ---     **Family History**    ---       Father: alive, diagnosed with HTN    ---    Mother: alive    ---    Negative for premature coronary artery disease or sudden cardiac death.    ---      **Social History**    ---     _Tobacco Use:_    Smoking  Patient is a Non smoker.    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 She drinks one cup of  coffee each day. She does not drink energy drinks.      **Allergies**    ---      N.K.D.A.    ---      **Review of Systems**    ---     _General/Constitutional_ :    Patient denies fevers or chills.    _Respiratory_ :    Patient denies hemoptysis.    _Gastrointestinal_ :    Patient denies blood in the stool.    _Peripheral Vascular_ :    Patient denies claudication.          **Reason for Appointment**    ---      1\. Palpitations    ---      **History of Present Illness**    ---     __ :    Laura Pennington returns to the office for further evaluation of palpitations.  She was last seen in my office in 2018. As you recall, Laura Pennington is a 24 year  old female with a history of ADD, anorexia, and cleidocranial dystosis. She  was doing well until September 2017 when she started to experience chest pain  when taking a deep breath. She also noticed the onset of a racing heart beat  with minimal exertion. 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 She was evaluated by a local cardiologist. Her cardiac evaluation  included a stress test, echocardiogram, and a 48 hour Holter monitor. All of  the studies were unremarkable. She was followed expectantly and her chest pain  resolved about a week later. However, she continued to have a sensation of an  inappropriately elevated heart rate at rest and with minimal exertion. She was  evaluated by Dr. Ignacia Palma in October 2017 and an event monitor was placed. The  monitor revealed heart rates up to 171 beats per minute while at rest and with  minimal exertion. Suspecting that she had either inappropriate sinus  tachycardia or an atrial  tachycardia, Dr. Ignacia Palma prescribed metoprolol  tartrate 12.5 mg twice daily. Her palpitations have improved, but they did not  resolve. I met with her in November 2017 and I suspected that her episodes  were secondary to either inappropriate sinus tachycardia or atrial  tachycardia. We reviewed her treatment options and her preference was to use  metoprolol on a prn basis.    Laura Pennington reports that she continues to have palpitations. They occur  randomly and cause her to feel short of breath. They seem to be triggered by  stress. She denies any episodes of chest pain, shortness of breath,  presyncope, syncope, orthopnea, or lower extremity edema. She complains of  feeling fatigued.      **Vital Signs**    ---    BP **130/87** , HR **100** , Ht 67, Wt **141** , BMI **22.08** , Oxygen sat %  **99** , Temp **98.4**.      **Physical Examination**    ---     _General Examination_ :    GENERAL APPEARANCE: well developed, well nourished.    HEAD: normocephalic, atraumatic.    ORAL CAVITY: moist mucous membranes.    NECK/THYROID: carotid pulse normal, no carotid bruit.    HEART: regular rate and rhythm, normal S1, normal S2, no murmur, rub, or  gallop.    LUNGS: clear to auscultation bilaterally.  CHEST: normal shape and expansion.    ABDOMEN: soft, nontender, nondistended, bowel sounds present.    EXTREMITIES: no edema.    PERIPHERAL PULSES: 2+ throughout.    NEUROLOGIC: alert and oriented.    PSYCH: cooperative with exam.         **EKG**    ---    Sinus tachycardia at a rate of 100 bpm. Left atrial enlargement. The PR  interval was 136 ms, QRS 86 ms, and the QTc was 400 ms.      **Assessment and Plan**    ---    1\. Palpitations - R00.2 (Primary)    ---     IMPRESSION: Laura Pennington has recently noticed an increase in frequency of her  palpitations. The etiology of her palpitations is unclear. I will place a 7  day telemetry monitor for further evaluation. She will return to my office  after the completion of the  study to discuss the results. I will send you  another letter at that time detailing the results of the study and outlining  my recommendations based on these findings. Her blood pressure was slightly  elevated today. She attributes this to inactivity and weight gain during the  pandemic. She is going to increase her level of exercise, reduce her sodium  intake, and try to lose weight. I will reassess her blood pressure control in  3 weeks. If her blood pressure remains elevated, then I may consider  initiation of a calcium channel blocker to treat both her hypertension and  palpitations.    Thank you for allowing me the opportunity to continue to participate in the  care of Laura Pennington. I will keep you informed of her progress. As always,  please feel free to call with any questions.    ---      **Treatment**    ---      **1\. Palpitations**    _IMAGING: Outpatient Mobile Telemetry_    ---       **Diagnostic Imaging**    ------    _Imaging: Electrocardiogram (EKG)_    ---    Electronically signed by Sherre Lain , MD on 02/27/2019 at 07:57 PM EST    Sign off status: Completed        * * [facility]    [address]    [place], [place] [id number]    Tel: [phone]    Fax: [phone]              * * *         Patient: Laura, Pennington DOB: 02/04/1996 Progress Note: Sherre Lain, MD  02/27/2019    ---    Note generated by eClinicalWorks EMR/PM Software ([email or web address])

## 2019-03-19 ENCOUNTER — Ambulatory Visit: Admitting: Cardiovascular Disease

## 2019-03-19 NOTE — Progress Notes (Signed)
* * *      **  Laura Pennington**    ------    25 Y old Female, DOB: 1995/07/26    [address] [place], [place], [place] [address]    Home: [phone]    Provider: Deanne Coffer        * * *    Telephone Encounter    ---    Answered by  Deanne Coffer Date: 03/19/2019       Time: 07:38 PM    Reason  monitor results    ------            Message                     Call the patient to discuss the results of her monitor.  She did not answer in her mailbox was full.  I will call her again tomorrow.  The monitor revealed either inappropriate sinus tachycardia or atrial tachycardia 190 bpm.  I will recommend initiation of diltiazem.                Action Taken                     [employer] 03/19/2019 7:39:17 PM >      I spoke to the patient and all episodes occured at rest.  I explained that she most likely has inappropriate sinus tachycardia or atrial tachycardia.  We reviewed her treatment options and her preference is to be followed expectantly.      Rayden Dock J 03/20/2019 3:21:45 PM >       Kaitlyn, please move her appointment scheduled for next week out one year and mail her a copy of her Zio report.  Thanks.      Sallie Staron J 03/20/2019 3:22:05 PM >      Leach,Kaitlyn  03/20/2019 3:42:33 PM > Spoke to patient, rescheduled appt, zio mailed.                    * * *                ---          * * *         Patient: Laura Pennington, Laura Pennington DOB: [birth year] Provider: Deanne Coffer  03/19/2019    ---    Note generated by eClinicalWorks EMR/PM Software ([email or web address])

## 2019-03-19 NOTE — Progress Notes (Signed)
* * *      **  Laura Pennington**    ------    32 Y old Female, DOB: 06-18-1995    [address], [place], [place], [place] [address]    Home: [phone]    Provider: Deanne Coffer        * * *    Telephone Encounter    ---    Answered by  Loretta Plume Date: 03/19/2019       Time: 09:02 AM    Reason  Zio final report    ------            Message                     Zio final report is ready on the website for your interpretation and signature. Thank you                Action Taken                     The report has been signed. Thanks.       Anaisabel Pederson J 03/19/2019 7:40:24 PM >                    * * *                ---          * * *         Patient: Laura Pennington DOB: 1995/07/12 Provider: Deanne Coffer  03/19/2019    ---    Note generated by eClinicalWorks EMR/PM Software ([email or web address])

## 2019-03-20 ENCOUNTER — Ambulatory Visit

## 2019-03-21 ENCOUNTER — Ambulatory Visit (HOSPITAL_BASED_OUTPATIENT_CLINIC_OR_DEPARTMENT_OTHER)

## 2020-03-19 ENCOUNTER — Ambulatory Visit: Admitting: Cardiovascular Disease

## 2020-03-19 NOTE — Progress Notes (Signed)
* * *      SHARAYA, BORUFF **DOB:** 09/11/1995 (25 yo F) **Acc No.** [id number] **DOS:**  03/19/2020    ---        [name]**    ------    65 Y old Female, DOB: [birth year]    [address], [place], [place], [place] [address]    Home: [phone]    Provider: Deanne Coffer        * * *    Telephone Encounter    ---    Answered by    Morrie Sheldon    Date: 03/19/2020        Time: 11:07 AM    Reason    N/S PJB - lm 2/10    ------            Message                      lm for cb                 Action Taken                      Leach,Kaitlyn  03/20/2020 12:46:04 PM > Spoke to pt and r/s to April per patient                    * * *              * * *        ---        Reason for Appointment    ---      1\. N/S [facility]    ---          * * *        Provider: Deanne Coffer 03/19/2020    ---    Note generated by eClinicalWorks EMR/PM Software ([email or web address])

## 2020-05-25 ENCOUNTER — Encounter

## 2020-05-25 ENCOUNTER — Encounter (HOSPITAL_BASED_OUTPATIENT_CLINIC_OR_DEPARTMENT_OTHER)

## 2020-05-26 ENCOUNTER — Encounter (INDEPENDENT_AMBULATORY_CARE_PROVIDER_SITE_OTHER): Admitting: Cardiovascular Disease

## 2020-05-28 ENCOUNTER — Ambulatory Visit (INDEPENDENT_AMBULATORY_CARE_PROVIDER_SITE_OTHER): Admitting: Cardiovascular Disease

## 2020-05-28 NOTE — Progress Notes (Deleted)
Laura Pennington returns to the office today for evaluation of palpitations secondary to either inappropriate sinus tachycardia or atrial tachycardia.    She reports that she has been feeling well. She denies any recent episodes of exertional chest pressure, dyspnea on exertion, orthopnea, paroxysmal nocturnal dyspnea, palpitations, dizziness, syncope, peripheral edema or claudication.    No past medical history on file.  No past surgical history on file.  No family history on file.  Social History     Tobacco Use   ? Smoking status: Not on file   ? Smokeless tobacco: Not on file   Substance Use Topics   ? Alcohol use: Not on file     Current Outpatient Medications   Medication Sig Dispense Refill   ? acetaminophen (Tylenol) 500 mg tablet Take 1,000 mg by mouth.     ? adapalene (Differin) 0.3 % gel Apply a pea-sized amount to the entire face nightly. Start every 3 days and slowly increase to nightly     ? amphetamine-dextroamphetamine (Adderall) 10 mg tablet Take 10 mg by mouth.     ? amphetamine-dextroamphetamine (Adderall) 5 mg tablet 5 mg.     ? amphetamine-dextroamphetamine XR (Adderall XR) 25 mg 24 hr capsule Take 25 mg by mouth.     ? drospirenone-ethinyl estradioL (Yaz, Gianvi) 3-0.02 mg tablet Take 1 tablet by mouth in the morning.     ? escitalopram (Lexapro) 20 mg tablet Take 20 mg by mouth in the morning.     ? ibuprofen 200 mg tablet Take 400 mg by mouth.     ? lisdexamfetamine (Vyvanse) 40 mg capsule Take 60 mg by mouth in the morning.     ? lisdexamfetamine (Vyvanse) 60 mg capsule Take 60 mg by mouth in the morning.     ? sertraline (Zoloft) 50 mg tablet Take 50 mg by mouth.       No current facility-administered medications for this visit.     Allergies   Allergen Reactions   ? Cefadroxil Rash       ROS:  Constitutional: denies fever or chills  Pulmonary: denies hemoptysis  Gastrointestinal: denies blood in the stool  Vascular: denies claudication.     PHYSICAL EXAM:  There were no vitals taken for  this visit.    GENERAL APPEARANCE:  Alert, well-nourished.  HEAD: Normocephalic.  EYES: Anicteric.  ENT: Moist mucous membranes.  NECK: No JVD, carotids full with no bruits.  LUNGS: Clear to auscultation.  CARDIAC: Regular rhythm, normal S1, normal S2, no S3, no murmurs, rubs, or gallops.  CHEST: Sternum and costochondral junctions nontender.  ABDOMEN: Soft, nontender, no abdominal bruits.    MUSCULOSKELETAL: No muscular tenderness.  EXTREMITIES: No edema,   PERIPHERAL PULSES: Posterior tibial pulses palpable bilaterally.  Radial pulses palpable bilaterally  NEUROLOGIC: Equal strength and sensation bilaterally.                             PSYCH: Alert and oriented x3.    DATA:    CARDIAC TESTING:  ECHO: [place]. LVEF 55%. Nml wall motion. Nml RV size/fn. Trivial MR/TR    Holter: 48 H. 10-30-15. Max HR 171 bpm at 9:52 pm. Sinus vs AT. Gradual onset and termination.    ETT: sept 2017. 13 min. Bruce. 115 to 196 bpm. 98%.    TTM: 12/25/15: Sx ST. Pt reports HR as high as 173 bpm w/min exertion/rest. TTM suggests inappropriate ST.     ECG: ***  LABS:     IMPRESSION/PLAN:  1. Inappropriate sinus tachycardia.  Return to clinic    Thank you for allowing me the opportunity to continue to participate in the care of Laura Pennington. As always, please feel free to call with any questions.     I spent 40 minutes today preparing for this visit, reviewing medications and tests, conducting face to face encounter with the patient, documenting clinical information in the electronic medical record and coordinating patient's care.
